# Patient Record
Sex: Female | Born: 1960 | Race: White | Hispanic: No | Marital: Married | Smoking: Current every day smoker
Health system: Southern US, Community
[De-identification: ages and names within clinical notes are randomized; demographics above are authoritative.]

## PROBLEM LIST (undated history)

## (undated) DIAGNOSIS — K602 Anal fissure, unspecified: Secondary | ICD-10-CM

## (undated) DIAGNOSIS — M1990 Unspecified osteoarthritis, unspecified site: Secondary | ICD-10-CM

## (undated) HISTORY — DX: Unspecified osteoarthritis, unspecified site: M19.90

## (undated) HISTORY — DX: Anal fissure, unspecified: K60.2

---

## 2004-04-06 HISTORY — PX: ABDOMINAL HYSTERECTOMY: SHX81

## 2007-11-02 ENCOUNTER — Emergency Department (HOSPITAL_COMMUNITY): Admission: EM | Admit: 2007-11-02 | Discharge: 2007-11-02 | Payer: Self-pay | Admitting: Emergency Medicine

## 2009-03-15 ENCOUNTER — Emergency Department (HOSPITAL_COMMUNITY): Admission: EM | Admit: 2009-03-15 | Discharge: 2009-03-15 | Payer: Self-pay | Admitting: Emergency Medicine

## 2009-11-04 IMAGING — CR DG RIBS W/ CHEST 3+V*R*
4 series · 4 of 4 positions shown · non-contrast
Comparison: None

CLINICAL DATA: Cough/chest congestion/right anterior not and
swelling

RIGHT RIBS AND CHEST - 3+ VIEW

[w chest pa]
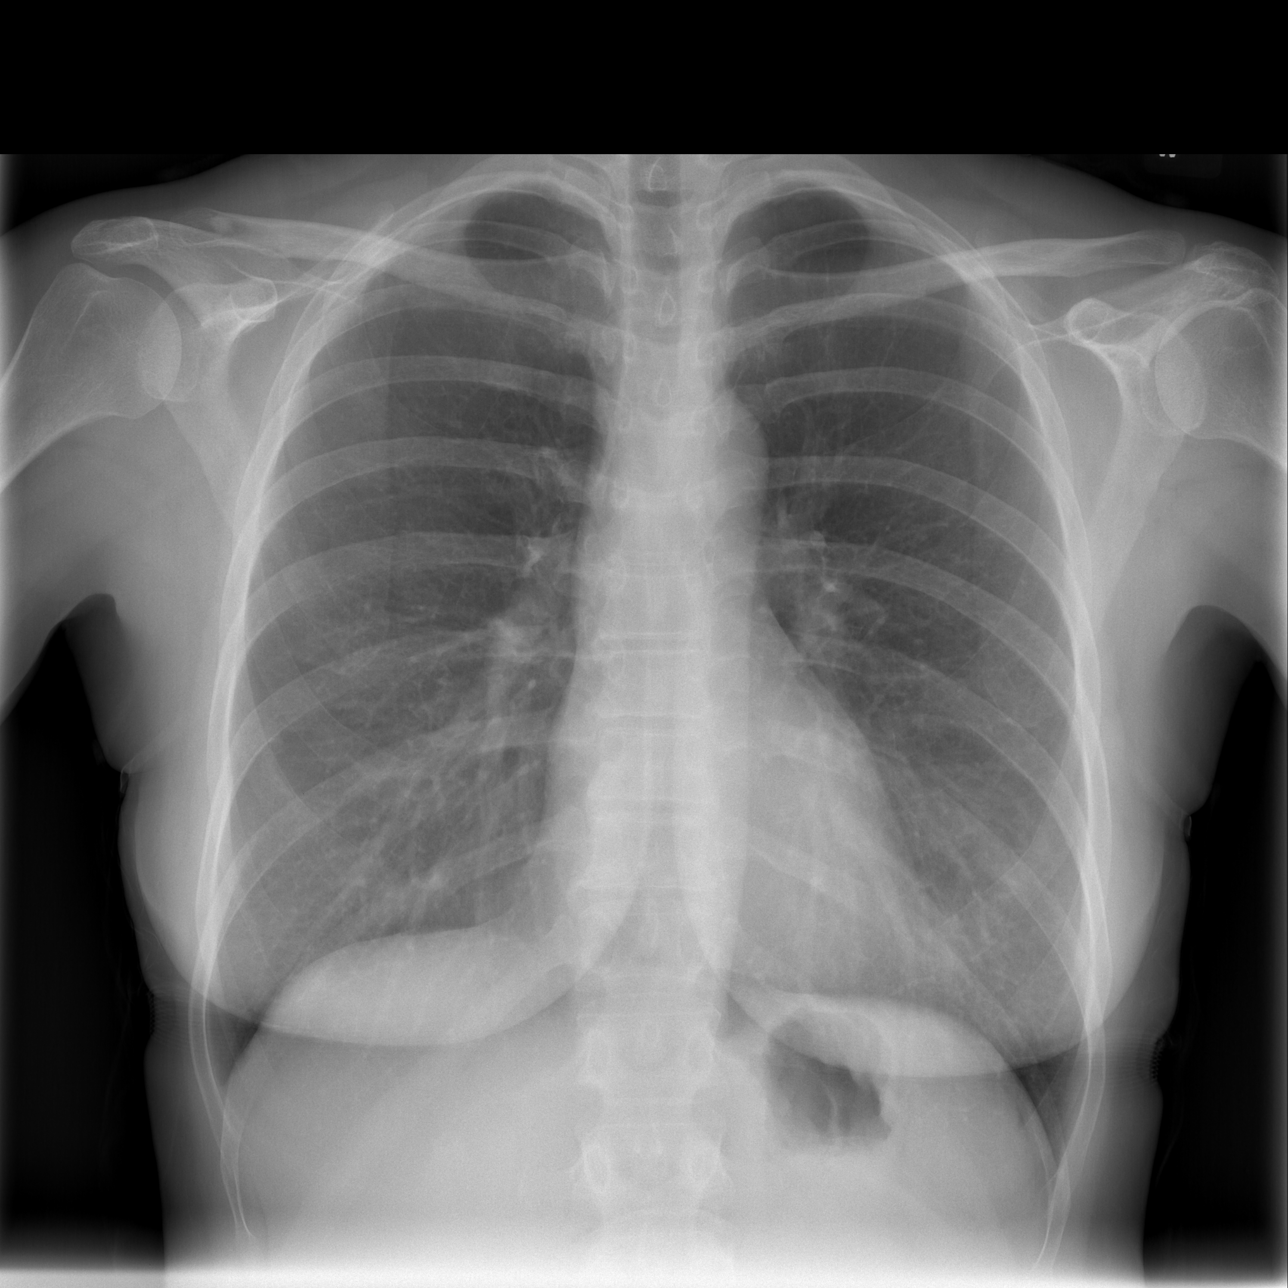

[w ribs ap/pa upper right *]
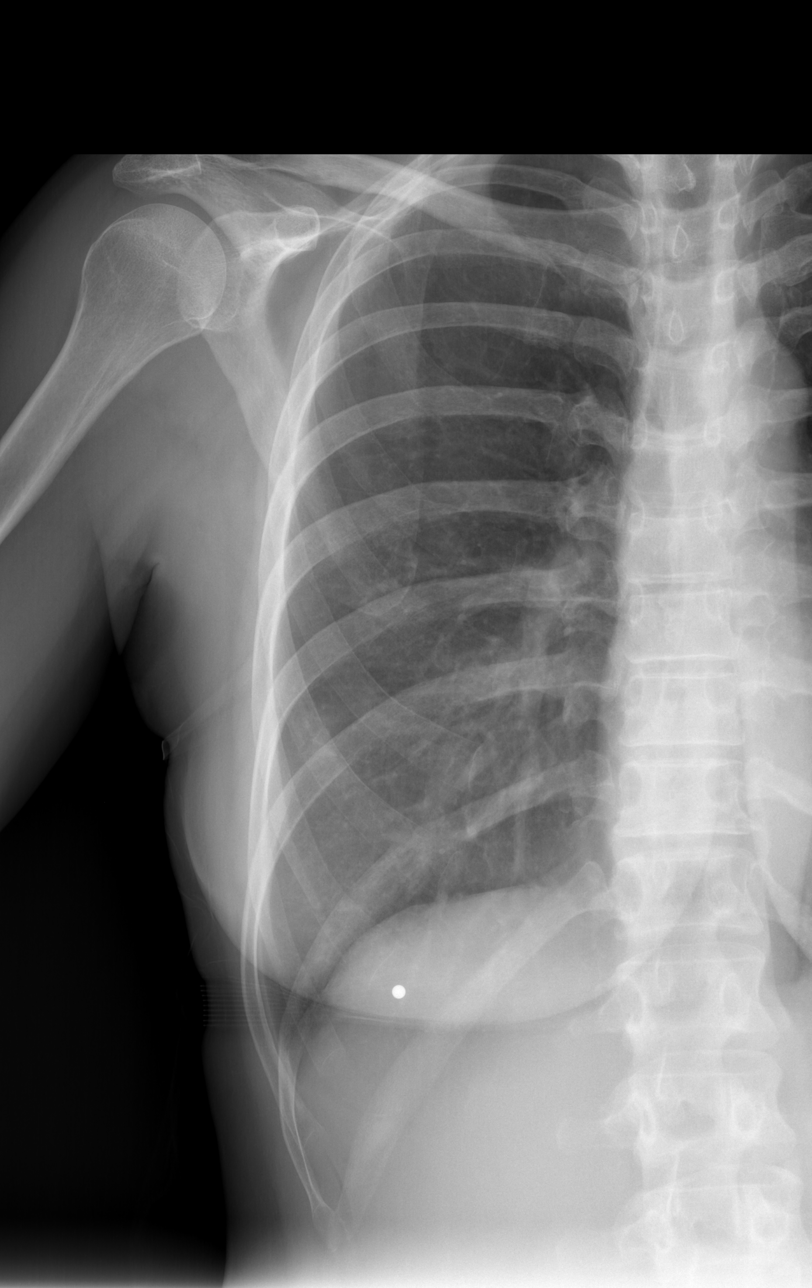

[w ribs ap/pa lower right *]
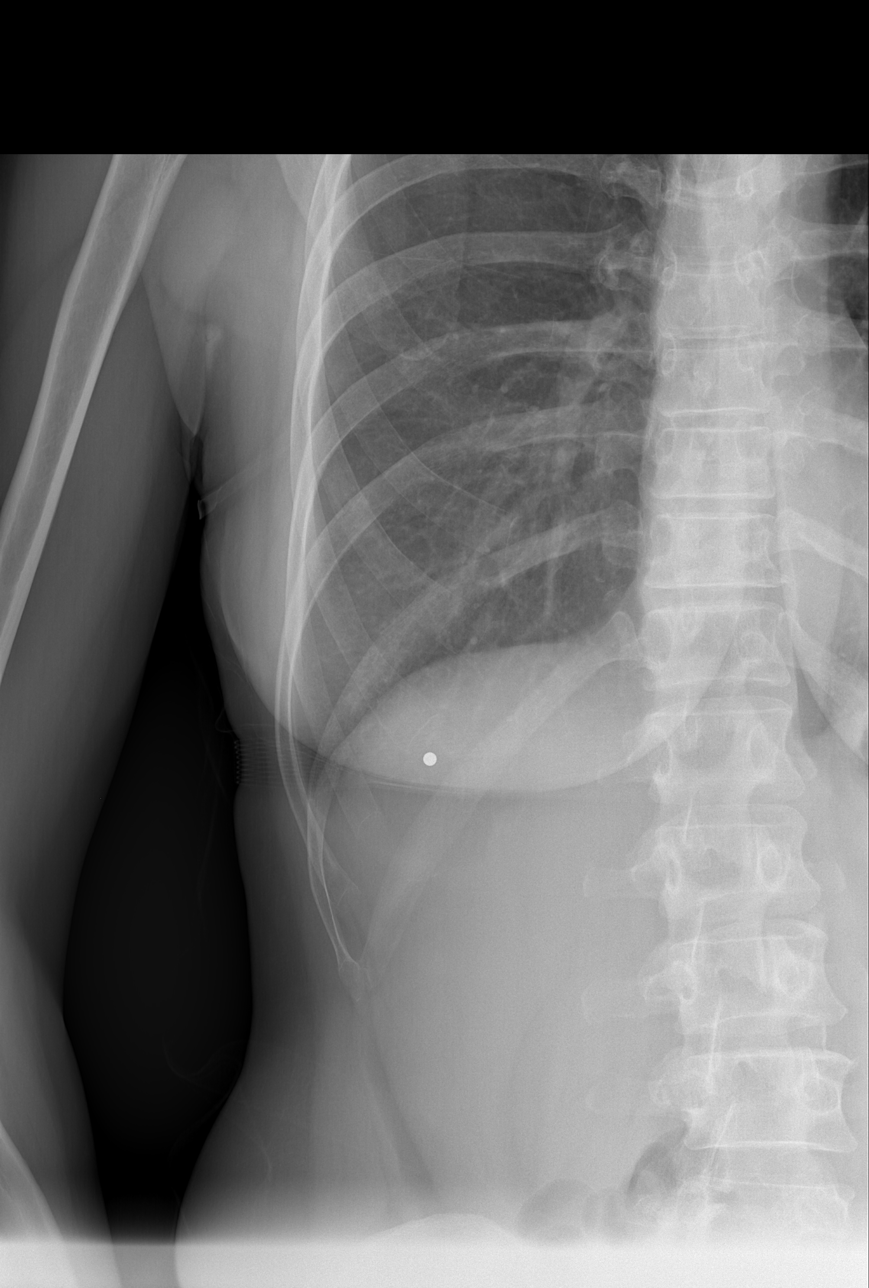

[w ribs oblique right *]
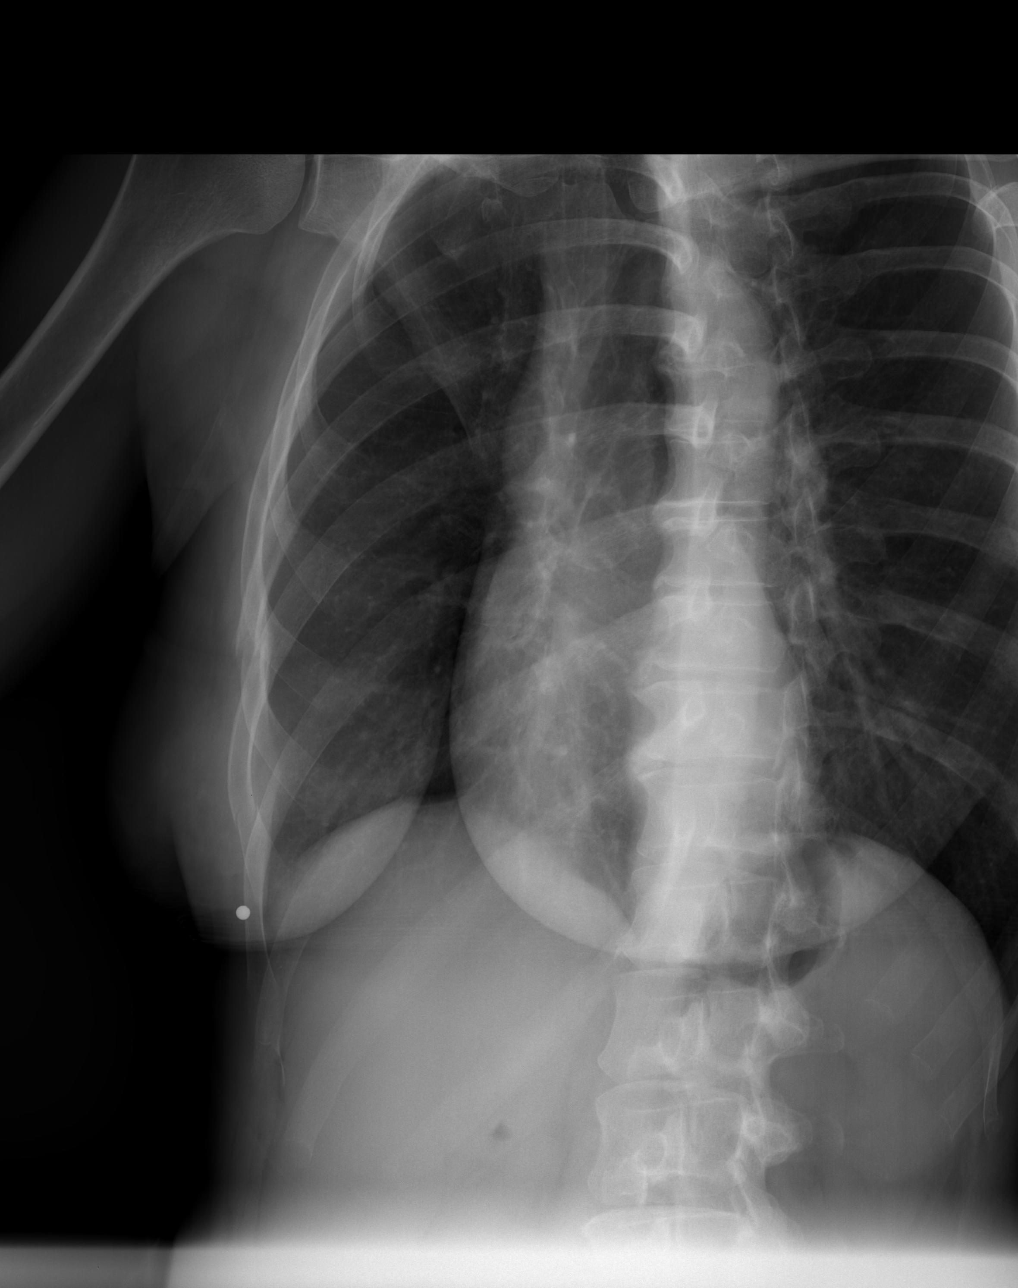

[4 of 4 positions shown; findings below may reference images not displayed]

FINDINGS: Heart and lungs normal.  No pleural fluid.  No obvious
fractures or lesions of the right rib cage.  A BB was placed at the
site of symptoms.  No soft tissue abnormality.
IMPRESSION: No acute or significant findings.

## 2011-08-11 LAB — DIFFERENTIAL
Basophils Absolute: 0
Basophils Relative: 0
Eosinophils Absolute: 0.1
Eosinophils Relative: 1
Lymphocytes Relative: 27
Lymphs Abs: 1.9
Monocytes Absolute: 0.3
Monocytes Relative: 4
Neutro Abs: 4.8
Neutrophils Relative %: 68

## 2011-08-11 LAB — CBC
HCT: 38.6
Hemoglobin: 13.6
MCHC: 35.3
MCV: 89.6
Platelets: 185
RBC: 4.31
RDW: 11.7
WBC: 7.1

## 2011-08-11 LAB — BASIC METABOLIC PANEL WITH GFR
Calcium: 9
GFR calc non Af Amer: >60
Glucose, Bld: 99
Potassium: 3.2 — ABNORMAL LOW
Sodium: 137

## 2011-08-11 LAB — URINALYSIS, ROUTINE W REFLEX MICROSCOPIC
Bilirubin Urine: NEGATIVE
Glucose, UA: NEGATIVE
Hgb urine dipstick: NEGATIVE
Nitrite: NEGATIVE
Urobilinogen, UA: 0.2
pH: 6

## 2011-08-11 LAB — URINE CULTURE
Colony Count: NO GROWTH
Culture: NO GROWTH

## 2011-08-11 LAB — URINE MICROSCOPIC-ADD ON

## 2011-08-11 LAB — BASIC METABOLIC PANEL
BUN: 8
CO2: 24
Chloride: 105
Creatinine, Ser: 0.59
GFR calc Af Amer: >60

## 2011-09-20 ENCOUNTER — Encounter (INDEPENDENT_AMBULATORY_CARE_PROVIDER_SITE_OTHER): Payer: Self-pay

## 2011-09-20 ENCOUNTER — Encounter (INDEPENDENT_AMBULATORY_CARE_PROVIDER_SITE_OTHER): Payer: Self-pay | Admitting: General Surgery

## 2011-09-20 ENCOUNTER — Ambulatory Visit (INDEPENDENT_AMBULATORY_CARE_PROVIDER_SITE_OTHER): Admitting: General Surgery

## 2011-09-20 VITALS — BP 118/82 | HR 72 | Temp 97.0°F | Resp 20 | Ht 64.5 in | Wt 141.2 lb

## 2011-09-20 DIAGNOSIS — K602 Anal fissure, unspecified: Secondary | ICD-10-CM

## 2011-09-20 NOTE — Progress Notes (Signed)
Chief Complaint  Patient presents with  . New Evaluation    eval of anal fissure     HPI Gail Bryan is a 50 y.o. female.   HPI  She is referred by Dr. 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  She has had problems with this, off and on, for about three years.  Initially, she had problems with constipation but this has resolved.  She currently is using Diltiazem cream which gives her temporary relief.  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  Past Medical History  Diagnosis Date  . Anal fissure   . Arthritis     Past Surgical History  Procedure Date  . Abdominal hysterectomy June 2005    History reviewed. No pertinent family history.  Social History History  Substance Use Topics  . Smoking status: Current Everyday Smoker -- 0.1 packs/day    Types: Cigarettes  . Smokeless tobacco: Never Used  . Alcohol Use: No    No Known Allergies  Current Outpatient Prescriptions  Medication Sig Dispense Refill  . estradiol (VIVELLE-DOT) 0.1 MG/24HR Place 1 patch onto the skin 2 (two) times a week.       Marland Kitchen HYDROcodone-acetaminophen (VICODIN) 5-500 MG per tablet Take 1 tablet by mouth at bedtime as needed.        . Probiotic Product (ALIGN PO) Take by mouth daily.        . traMADol (ULTRAM) 50 MG tablet Take 50 mg by mouth. Maximum dose= 8 tablets per day         Review of Systems Review of Systems  Constitutional: Negative.   Respiratory: Negative.   Cardiovascular: Negative.   Gastrointestinal: Positive for anal bleeding. Negative for abdominal pain.  Genitourinary: Negative for hematuria and difficulty urinating.  Neurological: Positive for headaches.    Blood pressure 118/82, pulse 72, temperature 97 F (36.1 C), temperature source Temporal, resp. rate 20, height 5' 4.5" (1.638 m), weight 141 lb 4 oz (64.071 kg).  Physical Exam Physical Exam  Constitutional: She appears well-developed and well-nourished. No distress.  HENT:  Head:  Normocephalic and atraumatic.  Cardiovascular: Normal rate and regular rhythm.   No murmur heard. Pulmonary/Chest: Effort normal. She has wheezes.  Genitourinary:       Tender posteriorly in anal canal; small fissure seen on limited anoscopy.  Musculoskeletal: Normal range of motion. She exhibits no edema.    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    Plan    Repair anal fissure via lateral internal sphincterotomy.  The procedure, risks, and aftercare were discussed with her.  Risks include but are not limited to bleeding, infection, anesthesia, anal incontinence, recurrent fissure.  She seems to understand and agrees with the plan.       Sarajean Dessert J 09/20/2011, 2:01 PM

## 2011-10-02 DIAGNOSIS — D235 Other benign neoplasm of skin of trunk: Secondary | ICD-10-CM

## 2011-10-09 ENCOUNTER — Telehealth (INDEPENDENT_AMBULATORY_CARE_PROVIDER_SITE_OTHER): Payer: Self-pay | Admitting: General Surgery

## 2011-10-09 ENCOUNTER — Ambulatory Visit (INDEPENDENT_AMBULATORY_CARE_PROVIDER_SITE_OTHER): Payer: Self-pay | Admitting: General Surgery

## 2011-10-09 ENCOUNTER — Encounter (INDEPENDENT_AMBULATORY_CARE_PROVIDER_SITE_OTHER): Payer: Self-pay

## 2011-10-09 ENCOUNTER — Telehealth (INDEPENDENT_AMBULATORY_CARE_PROVIDER_SITE_OTHER): Payer: Self-pay

## 2011-10-09 NOTE — Telephone Encounter (Signed)
Patient status post repair of anal fissure on 10/02/11. She called complaining of constant rectal pressure. Patient states it feels the same since before surgery. Had some bleeding but this has stopped. She is moving her bowels everyday. Feels some sharp pains after bowel movement sometimes but her major concern is the constant pressure. She is soaking in a tub and states this helps relieve some pain but the pressure is constantly there. Please advise.

## 2011-10-09 NOTE — Telephone Encounter (Signed)
I spoke with the pt and informed her that she was experiencing normal post op pain/swelling which may take a few weeks to resolve.  I did fax her a RTW note extending her recovery period through 10/15/11.  This was faxed to [phone].  The pt will call with any questions or concerns.

## 2011-10-25 ENCOUNTER — Encounter (INDEPENDENT_AMBULATORY_CARE_PROVIDER_SITE_OTHER): Payer: Self-pay

## 2011-10-25 ENCOUNTER — Ambulatory Visit (INDEPENDENT_AMBULATORY_CARE_PROVIDER_SITE_OTHER): Admitting: General Surgery

## 2011-10-25 ENCOUNTER — Encounter (INDEPENDENT_AMBULATORY_CARE_PROVIDER_SITE_OTHER): Payer: Self-pay | Admitting: General Surgery

## 2011-10-25 VITALS — BP 128/88 | HR 60 | Temp 97.4°F | Resp 18 | Ht 64.5 in | Wt 137.5 lb

## 2011-10-25 DIAGNOSIS — Z09 Encounter for follow-up examination after completed treatment for conditions other than malignant neoplasm: Secondary | ICD-10-CM

## 2011-10-25 NOTE — Patient Instructions (Signed)
You may return to full duty work 11/08/11.  You should avoid prolonged sitting or this situation will recur.

## 2011-10-25 NOTE — Progress Notes (Signed)
Operation:  EUA, dilatation, excision of perianal mass  Date:  Pathology:  Benign  HPI: She is here for her first postoperative visit.  She actually was doing well until she had to make a long drive for work and she had recurrence of her pain.   Physical Exam:  Perianal wound is clean and healing well.   Assessment:  Doing well postop. In my opinion, if she continues to have to do a lot of driving and sitting she will continue to have this problem with anal pain. I recommended that she try to avoid those types of situations.  Plan:  Return to full duty work November 08, 2011. Return visit p.r.n.

## 2013-12-24 ENCOUNTER — Encounter: Payer: Self-pay | Admitting: Internal Medicine

## 2016-07-25 ENCOUNTER — Ambulatory Visit (INDEPENDENT_AMBULATORY_CARE_PROVIDER_SITE_OTHER)

## 2016-07-25 ENCOUNTER — Ambulatory Visit (INDEPENDENT_AMBULATORY_CARE_PROVIDER_SITE_OTHER): Admitting: Podiatry

## 2016-07-25 ENCOUNTER — Encounter: Payer: Self-pay | Admitting: *Deleted

## 2016-07-25 VITALS — BP 128/88 | HR 60 | Resp 16 | Ht 65.0 in | Wt 179.0 lb

## 2016-07-25 DIAGNOSIS — M722 Plantar fascial fibromatosis: Secondary | ICD-10-CM | POA: Diagnosis not present

## 2016-07-25 MED ORDER — METHYLPREDNISOLONE 4 MG PO TBPK: ORAL_TABLET | ORAL | 0 refills | Status: DC

## 2016-07-25 MED ORDER — MELOXICAM 15 MG PO TABS: 15.0000 mg | ORAL_TABLET | Freq: Every day | ORAL | 3 refills | Status: AC

## 2016-07-25 MED ORDER — MELOXICAM 15 MG PO TABS: 15.0000 mg | ORAL_TABLET | Freq: Every day | ORAL | 3 refills | Status: DC

## 2016-07-25 NOTE — Progress Notes (Signed)
   Subjective:    Patient ID: MYRLE BAHRI, female    DOB: 1961-05-28, 55 y.o.   MRN: [id number]  HPI  Chief Complaint  Patient presents with  . Foot Pain    right heel with some ankle swelling / constant x 4 mo.  No known injury.         Review of Systems  Constitutional: Positive for activity change.  HENT: Positive for tinnitus.   Musculoskeletal: Positive for arthralgias and gait problem.  Neurological: Positive for headaches.  All other systems reviewed and are negative.      Objective:   Physical Exam: Vital signs are stable she is alert and oriented 3. Pulses are strongly palpable. Neurologic sensorium is intact. Deep tendon reflexes are intact. Muscle strength +5 over 5 dorsiflexion plantar flexors and inverters everters all into the musculature is intact. She is pain on palpation medially continue tubercle of the right heel otherwise all joints distal to the ankle range of motion are without crepitation. Radiographs demonstrate soft tissue increase in density of plantar fascia continuous insertion site of the right heel consistent with plantar fasciitis. Cutaneous evaluation demonstrates no open lesions or wounds.        Assessment & Plan:  Assessment: Plantar fasciitis right heel 4 months.  Plan: I injected the right heel today with Kenalog and local anesthetic place her plantar fascia brace and a night splint. Discussed appropriate shoe gear stretching exercises ice therapy sugar modifications. Provided her with both oral and written home going stretching instructions. Also start her on a Medrol Dosepak to be followed by meloxicam. I will follow up with her in 1 month.

## 2016-07-25 NOTE — Patient Instructions (Signed)

## 2016-08-22 ENCOUNTER — Encounter: Payer: Self-pay | Admitting: Podiatry

## 2016-08-22 ENCOUNTER — Ambulatory Visit (INDEPENDENT_AMBULATORY_CARE_PROVIDER_SITE_OTHER): Admitting: Podiatry

## 2016-08-22 DIAGNOSIS — M722 Plantar fascial fibromatosis: Secondary | ICD-10-CM | POA: Diagnosis not present

## 2016-08-22 MED ORDER — DICLOFENAC SODIUM 75 MG PO TBEC: 75.0000 mg | DELAYED_RELEASE_TABLET | Freq: Two times a day (BID) | ORAL | 3 refills | Status: AC

## 2016-08-22 NOTE — Progress Notes (Signed)
She presents today for follow-up of plantar fasciitis to the right foot. She states that is doing much better than it was previously however she is unable to take the meloxicam due to constipation. She states that she like to have another anti-inflammatory. She states that her foot is still probably only about 60% improved.  Objective: Vital signs are stable she's alert and oriented 3 pulses are palpable. Neurologic sensorium is intact. She has pain on palpation medial calcaneal tubercle of the right heel.  Assessment: Chronic intractable plantar fasciitis right.  Plan: This is not better next visit we will scan her for orthotics. I went ahead and injected the right heel today with Kenalog and local anesthetic once again. Placed her on diclofenac 75 mg #60 one by mouth twice a day. Call with questions or concerns.

## 2016-09-19 ENCOUNTER — Ambulatory Visit (INDEPENDENT_AMBULATORY_CARE_PROVIDER_SITE_OTHER): Payer: PRIVATE HEALTH INSURANCE | Admitting: Podiatry

## 2016-09-19 ENCOUNTER — Encounter: Payer: Self-pay | Admitting: Podiatry

## 2016-09-19 DIAGNOSIS — M722 Plantar fascial fibromatosis: Secondary | ICD-10-CM

## 2016-09-20 NOTE — Progress Notes (Signed)
At this point she presents today with a chief complaint of continued pain of the right plantar fascia. She states is still sore in the back and she points to the distal most aspect of the Achilles tendon as well as the plantar fascia area.  Objective: Vital signs are stable alert oriented 3 pulses are palpable. No change in neurovascular status unchanged physical exam. Still has pain on palpation medial calcaneal tubercle of the right heel and at the Achilles tendon insertion site.  Assessment: Achilles tendinitis/fasciitis right.  Plan: We scanned her today for some orthotics and follow up with her once those component. An additional injection reperformed that time.

## 2016-10-17 ENCOUNTER — Ambulatory Visit (INDEPENDENT_AMBULATORY_CARE_PROVIDER_SITE_OTHER): Admitting: Podiatry

## 2016-10-17 DIAGNOSIS — M722 Plantar fascial fibromatosis: Secondary | ICD-10-CM

## 2016-10-17 NOTE — Patient Instructions (Signed)

## 2016-11-14 ENCOUNTER — Ambulatory Visit (INDEPENDENT_AMBULATORY_CARE_PROVIDER_SITE_OTHER): Admitting: Podiatry

## 2016-11-14 ENCOUNTER — Encounter: Payer: Self-pay | Admitting: Podiatry

## 2016-11-14 DIAGNOSIS — M722 Plantar fascial fibromatosis: Secondary | ICD-10-CM

## 2016-11-14 NOTE — Progress Notes (Signed)
She presents today for follow-up of her Achilles tendinitis and plantar fasciitis of her right foot. She states left foot was so swollen Friday could not even get my boot as she refers to her right shoe. She states the orthotics feel really good.  Objective: Vital signs are stable she is alert and oriented 3. Pulses are palpable. Pain on palpation medially continued to well the right heel.  Assessment: Plantar fasciitis right.  Plan: Injected the right heel today and suggested she continue her orthotics.

## 2016-12-12 ENCOUNTER — Ambulatory Visit: Admitting: Podiatry

## 2023-12-11 NOTE — Progress Notes (Signed)
PUO
